# Patient Record
Sex: Female | Born: 2000 | Race: White | Hispanic: No | Marital: Single | Smoking: Never smoker
Health system: Southern US, Community
[De-identification: ages and names within clinical notes are randomized; demographics above are authoritative.]

## PROBLEM LIST (undated history)

## (undated) DIAGNOSIS — J3089 Other allergic rhinitis: Secondary | ICD-10-CM

## (undated) HISTORY — DX: Other allergic rhinitis: J30.89

---
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
## 2021-01-19 ENCOUNTER — Ambulatory Visit: Payer: 59 | Admitting: Cardiology

## 2022-10-22 DIAGNOSIS — S52024A Nondisplaced fracture of olecranon process without intraarticular extension of right ulna, initial encounter for closed fracture: Secondary | ICD-10-CM | POA: Diagnosis not present

## 2022-10-22 DIAGNOSIS — S52025A Nondisplaced fracture of olecranon process without intraarticular extension of left ulna, initial encounter for closed fracture: Secondary | ICD-10-CM | POA: Diagnosis not present

## 2022-10-22 DIAGNOSIS — Y9384 Activity, sleeping: Secondary | ICD-10-CM | POA: Diagnosis not present

## 2022-10-22 DIAGNOSIS — M79622 Pain in left upper arm: Secondary | ICD-10-CM | POA: Diagnosis not present

## 2022-10-22 DIAGNOSIS — W19XXXA Unspecified fall, initial encounter: Secondary | ICD-10-CM | POA: Diagnosis not present

## 2022-10-22 DIAGNOSIS — Y929 Unspecified place or not applicable: Secondary | ICD-10-CM | POA: Diagnosis not present

## 2022-11-06 DIAGNOSIS — S52025A Nondisplaced fracture of olecranon process without intraarticular extension of left ulna, initial encounter for closed fracture: Secondary | ICD-10-CM | POA: Diagnosis not present

## 2022-11-12 DIAGNOSIS — S52025D Nondisplaced fracture of olecranon process without intraarticular extension of left ulna, subsequent encounter for closed fracture with routine healing: Secondary | ICD-10-CM | POA: Diagnosis not present

## 2022-11-12 DIAGNOSIS — S52025A Nondisplaced fracture of olecranon process without intraarticular extension of left ulna, initial encounter for closed fracture: Secondary | ICD-10-CM | POA: Diagnosis not present

## 2023-01-23 ENCOUNTER — Telehealth: Payer: Self-pay | Admitting: Primary Care

## 2023-01-23 NOTE — Telephone Encounter (Signed)
Pt called requesting to pick up vaccination records? Call back # [phone]

## 2023-01-23 NOTE — Telephone Encounter (Signed)
Printed and placed up front for pickup.  Patient is aware

## 2024-06-30 ENCOUNTER — Ambulatory Visit: Payer: Self-pay | Admitting: General Practice

## 2024-06-30 ENCOUNTER — Encounter: Payer: Self-pay | Admitting: General Practice

## 2024-06-30 VITALS — BP 108/64 | HR 86 | Temp 98.4°F | Ht 65.5 in | Wt 125.0 lb

## 2024-06-30 DIAGNOSIS — R42 Dizziness and giddiness: Secondary | ICD-10-CM | POA: Diagnosis not present

## 2024-06-30 DIAGNOSIS — R5382 Chronic fatigue, unspecified: Secondary | ICD-10-CM | POA: Insufficient documentation

## 2024-06-30 DIAGNOSIS — Z7689 Persons encountering health services in other specified circumstances: Secondary | ICD-10-CM | POA: Diagnosis not present

## 2024-06-30 NOTE — Progress Notes (Signed)
 New Patient Office Visit  Subjective    Patient ID: Lauren Mosley, female    DOB: Feb 22, 2001  Age: 23 y.o. MRN: [id number]  CC:  Chief Complaint  Patient presents with   New Patient (Initial Visit)   Dizziness    Weakness, lightheadedness, can't tolerate heat, gets shakey, random nausea, chest pains x years. Patient was seen for this in 2021 when she saw Mallie Gaskins; patient states its getting worse and thinks she has POTS.     HPI Lauren Mosley is a 23 y.o. female presents to re-establish care.  Last PCP/ physical/labs: Mallie Gaskins, NP.  Discussed the use of AI scribe software for clinical note transcription with the patient, who gave verbal consent to proceed.  History of Present Illness Lauren Mosley is a 23 year old female who presents with persistent weakness, lightheadedness, and heat intolerance.  She has been experiencing persistent weakness, lightheadedness, and heat intolerance since 2021. These symptoms occur multiple times a week with varying severity. Upon getting out of bed, she often feels lightheaded with momentary blackouts in her vision. Physical activity exacerbates her symptoms, limiting her walking to about ten minutes before feeling shaky or weak. Caffeine, inadequate sleep, excessive activity, and heat are identified as aggravating factors.  She underwent a cardiac evaluation in the past, including a two-week heart monitor, EKG, echocardiogram, and blood work, all of which returned normal results. She did not follow up with cardiology after moving to [place]  for school for two years.  Over the past month, she has been tracking her symptoms and notes that reducing caffeine intake has significantly decreased her frequency. Nausea can trigger or accompany her symptoms, though it does not occur every time.  In addition to her primary symptoms, she experiences headaches, chest pain, and low energy almost daily. She occasionally feels palpitations but does  not associate them with her other symptoms.  She was in [place]  for school for two years and has recently returned home. No one-sided body weakness. Occasional headaches and intermittent chest pain. Dizziness and low energy almost daily.   No outpatient encounter medications on file as of 06/30/2024.   No facility-administered encounter medications on file as of 06/30/2024.    Past Medical History:  Diagnosis Date   Environmental and seasonal allergies     History reviewed. No pertinent surgical history.  Family History  Problem Relation Age of Onset   ADD / ADHD Mother    ADD / ADHD Brother    Cancer Maternal Grandmother    Early death Maternal Grandmother    Hypertension Maternal Grandfather    Cancer Maternal Grandfather    Asthma Paternal Grandfather    High Cholesterol Paternal Grandfather     Social History   Socioeconomic History   Marital status: Single    Spouse name: Not on file   Number of children: Not on file   Years of education: Not on file   Highest education level: Some college, no degree  Occupational History   Not on file  Tobacco Use   Smoking status: Never   Smokeless tobacco: Never  Vaping Use   Vaping status: Never Used  Substance and Sexual Activity   Alcohol use: Never   Drug use: Never   Sexual activity: Never  Other Topics Concern   Not on file  Social History Narrative   Not on file   Social Drivers of Health   Financial Resource Strain: Patient Declined (06/29/2024)   Overall Financial  Resource Strain (CARDIA)    Difficulty of Paying Living Expenses: Patient declined  Food Insecurity: Patient Declined (06/29/2024)   Hunger Vital Sign    Worried About Running Out of Food in the Last Year: Patient declined    Ran Out of Food in the Last Year: Patient declined  Transportation Needs: No Transportation Needs (06/29/2024)   PRAPARE - Administrator, Civil Service (Medical): No    Lack of Transportation (Non-Medical): No   Physical Activity: Insufficiently Active (06/29/2024)   Exercise Vital Sign    Days of Exercise per Week: 4 days    Minutes of Exercise per Session: 10 min  Stress: No Stress Concern Present (06/29/2024)   [employer] of Occupational Health - Occupational Stress Questionnaire    Feeling of Stress: Only a little  Social Connections: Moderately Integrated (06/29/2024)   Social Connection and Isolation Panel    Frequency of Communication with Friends and Family: Three times a week    Frequency of Social Gatherings with Friends and Family: Twice a week    Attends Religious Services: More than 4 times per year    Active Member of [employer] or Organizations: Yes    Attends Engineer, structural: More than 4 times per year    Marital Status: Never married  Intimate Partner Violence: Not on file    Review of Systems  Constitutional:  Negative for chills and fever.  Respiratory:  Negative for shortness of breath.   Cardiovascular:  Positive for chest pain and palpitations. Negative for leg swelling.  Gastrointestinal:  Positive for nausea. Negative for abdominal pain, constipation, diarrhea, heartburn and vomiting.  Genitourinary:  Negative for dysuria, frequency and urgency.  Neurological:  Positive for dizziness, weakness and headaches.  Endo/Heme/Allergies:  Negative for polydipsia.  Psychiatric/Behavioral:  Negative for depression and suicidal ideas. The patient is not nervous/anxious.         Objective    BP 108/64   Pulse 86   Temp 98.4 F (36.9 C) (Oral)   Ht 5' 5.5 (1.664 m)   Wt 125 lb (56.7 kg)   LMP 06/16/2024 (Exact Date)   SpO2 99%   BMI 20.48 kg/m   Orthostatic VS for the past 24 hrs (Last 3 readings):  BP- Lying Pulse- Lying BP- Sitting Pulse- Sitting BP- Standing at 0 minutes Pulse- Standing at 0 minutes  06/30/24 1540 102/68 93 102/68 86 106/80 115     Physical Exam Vitals and nursing note reviewed.  Constitutional:      Appearance: Normal  appearance.  Cardiovascular:     Rate and Rhythm: Normal rate and regular rhythm.     Pulses: Normal pulses.     Heart sounds: Normal heart sounds.  Pulmonary:     Effort: Pulmonary effort is normal.     Breath sounds: Normal breath sounds.  Neurological:     Mental Status: She is alert and oriented to person, place, and time.  Psychiatric:        Mood and Affect: Mood normal.        Behavior: Behavior normal.        Thought Content: Thought content normal.        Judgment: Judgment normal.          Assessment & Plan:  Lightheadedness Assessment & Plan: Chronic. Has been evaluated by cardiology in 2021.  EKG tracing is personally reviewed.  EKG notes NSR.  No acute changes. Compared to EKG in 2021; similar results.  Orthostatic vitals show  increase in HR however, BP remains consistent.   Will recheck labs today.  Referral placed for cardiology.  Follow up in 2 weeks.   Orders: -     CBC -     Comprehensive metabolic panel with GFR -     Lipid panel -     TSH -     VITAMIN D  25 Hydroxy (Vit-D Deficiency, Fractures) -     Vitamin B12 -     EKG 12-Lead -     Ambulatory referral to Cardiology  Establishing care with new doctor, encounter for Assessment & Plan: EMR reviewed briefly.    Chronic fatigue Assessment & Plan: Reviewed notes, procedure results and labs from 2021.   Orders: -     CBC -     Comprehensive metabolic panel with GFR -     Lipid panel -     TSH -     VITAMIN D  25 Hydroxy (Vit-D Deficiency, Fractures) -     Vitamin B12    Assessment and Plan Assessment & Plan Suspected postural orthostatic tachycardia syndrome (POTS) with associated lightheadedness, dizziness, weakness, fatigue, intermittent chest pain, headache, and nausea Symptoms consistent with suspected POTS, exacerbated by heat, caffeine, lack of sleep, and physical activity. Previous cardiac evaluations normal. Further cardiology evaluation needed for confirmation. - Repeat EKG  today. - Perform lab work today, including cholesterol and vitamin D  levels. - Refer to cardiology for further evaluation and potential diagnosis of POTS. - Perform orthostatic vital signs assessment.   Return in about 2 weeks (around 07/14/2024) for physical.   Carrol Aurora, NP

## 2024-06-30 NOTE — Assessment & Plan Note (Addendum)
 Chronic. Has been evaluated by cardiology in 2021.  EKG tracing is personally reviewed.  EKG notes NSR.  No acute changes. Compared to EKG in 2021; similar results.  Orthostatic vitals show increase in HR however, BP remains consistent.   Will recheck labs today.  Referral placed for cardiology.  Follow up in 2 weeks.

## 2024-06-30 NOTE — Assessment & Plan Note (Signed)
 Reviewed notes, procedure results and labs from 2021.

## 2024-06-30 NOTE — Patient Instructions (Addendum)
 Stop by the lab prior to leaving today. I will notify you of your results once received.    VISIT SUMMARY: Today, you were seen for persistent weakness, lightheadedness, and heat intolerance that you have been experiencing since 2021. You also reported headaches, chest pain, and low energy almost daily. You have previously undergone cardiac evaluations, which were normal, but you have not followed up with cardiology since moving to [place]  for school. Recently, you have been tracking your symptoms and noted that reducing caffeine intake has helped decrease their frequency.  YOUR PLAN: -SUSPECTED POSTURAL ORTHOSTATIC TACHYCARDIA SYNDROME (POTS): POTS is a condition that affects blood flow and can cause symptoms like lightheadedness, dizziness, weakness, fatigue, chest pain, headache, and nausea, especially when standing up. Your symptoms are consistent with POTS and are worsened by heat, caffeine, lack of sleep, and physical activity. We will repeat an EKG and perform lab work today, including checking your cholesterol and vitamin D  levels. You will also be referred to cardiology for further evaluation and a potential diagnosis of POTS. Additionally, we will perform an orthostatic vital signs assessment to check your blood pressure and heart rate changes when you move from lying down to standing up.  INSTRUCTIONS: Please follow up with cardiology for further evaluation and potential diagnosis of POTS. Make sure to get your EKG and lab work done today. We will also perform an orthostatic vital signs assessment during your visit.  Follow up in 2 weeks for a physical.   It was a pleasure meeting you!                  Contains text generated by [employer].                                 Contains text generated by [employer]bridge.

## 2024-06-30 NOTE — Assessment & Plan Note (Signed)
 EMR reviewed briefly.

## 2024-07-01 ENCOUNTER — Ambulatory Visit: Payer: Self-pay | Admitting: General Practice

## 2024-07-01 DIAGNOSIS — E559 Vitamin D deficiency, unspecified: Secondary | ICD-10-CM

## 2024-07-01 DIAGNOSIS — R42 Dizziness and giddiness: Secondary | ICD-10-CM

## 2024-07-01 LAB — CBC
HCT: 40.9 % (ref 36.0–46.0)
Hemoglobin: 13.7 g/dL (ref 12.0–15.0)
MCHC: 33.6 g/dL (ref 30.0–36.0)
MCV: 91.3 fl (ref 78.0–100.0)
Platelets: 227 K/uL (ref 150.0–400.0)
RBC: 4.48 Mil/uL (ref 3.87–5.11)
RDW: 12.4 % (ref 11.5–15.5)
WBC: 5 K/uL (ref 4.0–10.5)

## 2024-07-01 LAB — COMPREHENSIVE METABOLIC PANEL WITH GFR
ALT: 14 U/L (ref 0–35)
AST: 16 U/L (ref 0–37)
Albumin: 4.8 g/dL (ref 3.5–5.2)
Alkaline Phosphatase: 18 U/L — ABNORMAL LOW (ref 39–117)
BUN: 12 mg/dL (ref 6–23)
CO2: 26 meq/L (ref 19–32)
Calcium: 9.8 mg/dL (ref 8.4–10.5)
Chloride: 103 meq/L (ref 96–112)
Creatinine, Ser: 0.79 mg/dL (ref 0.40–1.20)
GFR: 106.63 mL/min (ref >60.00)
Glucose, Bld: 98 mg/dL (ref 70–99)
Potassium: 4 meq/L (ref 3.5–5.1)
Sodium: 138 meq/L (ref 135–145)
Total Bilirubin: 0.5 mg/dL (ref 0.2–1.2)
Total Protein: 7.4 g/dL (ref 6.0–8.3)

## 2024-07-01 LAB — LIPID PANEL
Cholesterol: 147 mg/dL (ref 0–200)
HDL: 62 mg/dL (ref >39.00)
LDL Cholesterol: 75 mg/dL (ref 0–99)
NonHDL: 85.37
Total CHOL/HDL Ratio: 2
Triglycerides: 54 mg/dL (ref 0.0–149.0)
VLDL: 10.8 mg/dL (ref 0.0–40.0)

## 2024-07-01 LAB — TSH: TSH: 2.12 u[IU]/mL (ref 0.35–5.50)

## 2024-07-01 LAB — VITAMIN B12: Vitamin B-12: 592 pg/mL (ref 211–911)

## 2024-07-01 LAB — VITAMIN D 25 HYDROXY (VIT D DEFICIENCY, FRACTURES): VITD: 29.43 ng/mL — ABNORMAL LOW (ref 30.00–100.00)

## 2024-07-01 MED ORDER — VITAMIN D (ERGOCALCIFEROL) 1.25 MG (50000 UNIT) PO CAPS: 50000.0000 [IU] | ORAL_CAPSULE | ORAL | 0 refills | Status: AC

## 2024-07-15 ENCOUNTER — Encounter: Payer: Self-pay | Admitting: General Practice

## 2024-07-15 ENCOUNTER — Ambulatory Visit (INDEPENDENT_AMBULATORY_CARE_PROVIDER_SITE_OTHER): Admitting: General Practice

## 2024-07-15 VITALS — BP 112/62 | HR 95 | Temp 98.4°F | Ht 65.5 in | Wt 126.0 lb

## 2024-07-15 DIAGNOSIS — Z23 Encounter for immunization: Secondary | ICD-10-CM

## 2024-07-15 DIAGNOSIS — Z0000 Encounter for general adult medical examination without abnormal findings: Secondary | ICD-10-CM | POA: Insufficient documentation

## 2024-07-15 DIAGNOSIS — E559 Vitamin D deficiency, unspecified: Secondary | ICD-10-CM | POA: Diagnosis not present

## 2024-07-15 DIAGNOSIS — Z124 Encounter for screening for malignant neoplasm of cervix: Secondary | ICD-10-CM

## 2024-07-15 NOTE — Progress Notes (Signed)
 Established Patient Office Visit  Subjective   Patient ID: Lauren Mosley, female    DOB: 02-Nov-2001  Age: 23 y.o. MRN: [id number]  Chief Complaint  Patient presents with  . Annual Exam  . Menorrhagia    Patient would like to discuss this as some of her cycles are severe.     HPI  Lauren Mosley is a 23 year old female with past medical history of chronic fatigue for complete physical and follow up of chronic conditions.  Immunizations: -Tetanus: Completed in 2014; due  Diet: Fair diet.  Exercise: No regular exercise.  Eye exam: Completed several years ago.  Dental exam: Completed several years ago.   Pap Smear: due; referral placed for GYN.  Patient Active Problem List   Diagnosis Date Noted  . Encounter for screening and preventative care 07/15/2024  . Establishing care with new doctor, encounter for 06/30/2024  . Chronic fatigue 06/30/2024  . Hay fever 09/05/2020  . Lightheadedness 09/05/2020   Past Medical History:  Diagnosis Date  . Environmental and seasonal allergies    History reviewed. No pertinent surgical history. No Known Allergies       07/15/2024   10:35 AM 06/30/2024    3:31 PM  Depression screen PHQ 2/9  Decreased Interest 0 0  Down, Depressed, Hopeless 0 0  PHQ - 2 Score 0 0  Altered sleeping 1 1  Tired, decreased energy 1 1  Change in appetite 0 0  Feeling bad or failure about yourself  0 0  Trouble concentrating 0 1  Moving slowly or fidgety/restless 0 0  Suicidal thoughts 0 0  PHQ-9 Score 2 3  Difficult doing work/chores Not difficult at all Somewhat difficult       07/15/2024   10:35 AM 06/30/2024    3:32 PM  GAD 7 : Generalized Anxiety Score  Nervous, Anxious, on Edge 0 1  Control/stop worrying 0 0  Worry too much - different things 0 0  Trouble relaxing 0 0  Restless 0 0  Easily annoyed or irritable 0 0  Afraid - awful might happen 0 0  Total GAD 7 Score 0 1  Anxiety Difficulty Not difficult at all Not difficult at all       Review of Systems  Constitutional:  Negative for chills, fever, malaise/fatigue and weight loss.  HENT:  Negative for congestion, ear discharge, ear pain, hearing loss, nosebleeds, sinus pain, sore throat and tinnitus.   Eyes:  Negative for blurred vision, double vision, pain, discharge and redness.  Respiratory:  Negative for cough, shortness of breath, wheezing and stridor.   Cardiovascular:  Negative for chest pain, palpitations and leg swelling.  Gastrointestinal:  Negative for abdominal pain, constipation, diarrhea, heartburn, nausea and vomiting.  Genitourinary:  Negative for dysuria, frequency and urgency.  Musculoskeletal:  Negative for myalgias.  Skin:  Negative for rash.  Neurological:  Negative for dizziness, tingling, seizures, weakness and headaches.  Endo/Heme/Allergies:  Negative for polydipsia.  Psychiatric/Behavioral:  Negative for depression, substance abuse and suicidal ideas. The patient is not nervous/anxious.       Objective:     BP 112/62   Pulse 95   Temp 98.4 F (36.9 C) (Temporal)   Ht 5' 5.5 (1.664 m)   Wt 126 lb (57.2 kg)   LMP 07/10/2024 (Exact Date)   SpO2 99%   BMI 20.65 kg/m  BP Readings from Last 3 Encounters:  07/15/24 112/62  06/30/24 108/64  12/08/20 107/70   Wt Readings from Last  3 Encounters:  07/15/24 126 lb (57.2 kg)  06/30/24 125 lb (56.7 kg)  12/08/20 124 lb (56.2 kg) (49%, Z= -0.02)*   * Growth percentiles are based on CDC (Girls, 2-20 Years) data.      Physical Exam Vitals and nursing note reviewed.  Constitutional:      Appearance: Normal appearance.  HENT:     Head: Normocephalic and atraumatic.     Right Ear: Tympanic membrane, ear canal and external ear normal.     Left Ear: Tympanic membrane, ear canal and external ear normal.     Nose: Nose normal.     Mouth/Throat:     Mouth: Mucous membranes are moist.     Pharynx: Oropharynx is clear.  Eyes:     Conjunctiva/sclera: Conjunctivae normal.     Pupils:  Pupils are equal, round, and reactive to light.  Cardiovascular:     Rate and Rhythm: Normal rate and regular rhythm.     Pulses: Normal pulses.     Heart sounds: Normal heart sounds.  Pulmonary:     Effort: Pulmonary effort is normal.     Breath sounds: Normal breath sounds.  Abdominal:     General: Abdomen is flat. Bowel sounds are normal.     Palpations: Abdomen is soft.  Musculoskeletal:        General: Normal range of motion.     Cervical back: Normal range of motion.  Skin:    General: Skin is warm and dry.     Capillary Refill: Capillary refill takes less than 2 seconds.  Neurological:     General: No focal deficit present.     Mental Status: She is alert and oriented to person, place, and time. Mental status is at baseline.  Psychiatric:        Mood and Affect: Mood normal.        Behavior: Behavior normal.        Thought Content: Thought content normal.        Judgment: Judgment normal.      No results found for any visits on 07/15/24.     The ASCVD Risk score (Arnett DK, et al., 2019) failed to calculate for the following reasons:   The 2019 ASCVD risk score is only valid for ages 57 to 46    Assessment & Plan:  Encounter for screening and preventative care Assessment & Plan: Immunizations tdap given today. Pap smear due Discussed the importance of a healthy diet and regular exercise in order for weight loss, and to reduce the risk of further co-morbidity.  Exam stable. Labs pending.  Follow up in 1 year for repeat physical.    Screening for cervical cancer -     Ambulatory referral to Gynecology  Need for Tdap vaccination -     Tdap vaccine greater than or equal to 7yo IM  Vitamin D  deficiency -     VITAMIN D  25 Hydroxy (Vit-D Deficiency, Fractures); Future     Return in about 1 year (around 07/15/2025) for physical.    Carrol Aurora, NP

## 2024-07-15 NOTE — Assessment & Plan Note (Signed)
 Immunizations tdap- given today.  Pap smear due  Discussed the importance of a healthy diet and regular exercise in order for weight loss, and to reduce the risk of further co-morbidity.  Exam stable. Labs pending.  Follow up in 1 year for repeat physical.

## 2024-07-15 NOTE — Patient Instructions (Signed)
 Schedule lab only appointment for three months.  You will either be contacted via phone regarding your referral to gynecology, or you may receive a letter on your MyChart portal from our referral team with instructions for scheduling an appointment. Please let us  know if you have not been contacted by anyone within two weeks.  Follow up in one year for physical.   It was a pleasure to see you today!

## 2024-07-26 ENCOUNTER — Ambulatory Visit: Attending: Cardiology | Admitting: Cardiology

## 2024-07-26 ENCOUNTER — Encounter: Payer: Self-pay | Admitting: Cardiology

## 2024-07-26 VITALS — BP 80/54 | HR 80 | Ht 65.0 in | Wt 126.6 lb

## 2024-07-26 DIAGNOSIS — G909 Disorder of the autonomic nervous system, unspecified: Secondary | ICD-10-CM | POA: Diagnosis not present

## 2024-07-26 DIAGNOSIS — R55 Syncope and collapse: Secondary | ICD-10-CM | POA: Diagnosis not present

## 2024-07-26 NOTE — Progress Notes (Signed)
 Cardiology Office Note:    Date:  07/26/2024   ID:  Lauren Mosley, DOB 03-28-2001, MRN [id number]  PCP:  Vincente Shivers, NP  [facility] Cardiologist:  None  [facility] Electrophysiologist:  None   Referring MD: Vincente Shivers, NP   Chief Complaint  Patient presents with   New Patient (Initial Visit)    Pt symptoms every morning/every day.    History of Present Illness:    Lauren Mosley is a 23 y.o. female with no significant past medical history who is being seen for presyncope.  Previously seen by myself 3 years ago for similar symptoms.  Endorses a constellation of symptoms including dizziness, blurry vision, palpitations sometimes with standing, recently sitting while playing the keyboard at her church.  Denies syncope.  Symptoms get worse if heart awareness.  She has been drinking water, and trying to stay hydrated but symptoms seem to have worsened of late.     Prior notes/testing Cardiac monitor 12/2020 no significant or sustained arrhythmias. Echocardiogram 11/02/2020 showed normal systolic and diastolic function, EF 60 to 65%.  Past Medical History:  Diagnosis Date   Environmental and seasonal allergies     History reviewed. No pertinent surgical history.  Current Medications: Current Meds  Medication Sig   Vitamin D , Ergocalciferol , (DRISDOL ) 1.25 MG (50000 UNIT) CAPS capsule Take 1 capsule (50,000 Units total) by mouth every 7 (seven) days.     Allergies:   Patient has no known allergies.   Social History   Socioeconomic History   Marital status: Single    Spouse name: Not on file   Number of children: Not on file   Years of education: Not on file   Highest education level: Some college, no degree  Occupational History   Not on file  Tobacco Use   Smoking status: Never   Smokeless tobacco: Never  Vaping Use   Vaping status: Never Used  Substance and Sexual Activity   Alcohol use: Never   Drug use: Never   Sexual activity: Never  Other  Topics Concern   Not on file  Social History Narrative   Not on file   Social Drivers of Health   Financial Resource Strain: Patient Declined (06/29/2024)   Overall Financial Resource Strain (CARDIA)    Difficulty of Paying Living Expenses: Patient declined  Food Insecurity: Patient Declined (06/29/2024)   Hunger Vital Sign    Worried About Running Out of Food in the Last Year: Patient declined    Ran Out of Food in the Last Year: Patient declined  Transportation Needs: No Transportation Needs (06/29/2024)   PRAPARE - Administrator, Civil Service (Medical): No    Lack of Transportation (Non-Medical): No  Physical Activity: Insufficiently Active (06/29/2024)   Exercise Vital Sign    Days of Exercise per Week: 4 days    Minutes of Exercise per Session: 10 min  Stress: No Stress Concern Present (06/29/2024)   [employer] of Occupational Health - Occupational Stress Questionnaire    Feeling of Stress: Only a little  Social Connections: Moderately Integrated (06/29/2024)   Social Connection and Isolation Panel    Frequency of Communication with Friends and Family: Three times a week    Frequency of Social Gatherings with Friends and Family: Twice a week    Attends Religious Services: More than 4 times per year    Active Member of [employer] or Organizations: Yes    Attends Banker Meetings: More than 4 times per  year    Marital Status: Never married     Family History: The patient's family history includes ADD / ADHD in her brother and mother; Asthma in her paternal grandfather; Cancer in her maternal grandfather and maternal grandmother; Early death in her maternal grandmother; High Cholesterol in her paternal grandfather; Hypertension in her maternal grandfather.  ROS:   Please see the history of present illness.     All other systems reviewed and are negative.  EKGs/Labs/Other Studies Reviewed:    The following studies were reviewed today:  EKG  Interpretation Date/Time:  Monday July 26 2024 10:53:32 EDT Ventricular Rate:  80 PR Interval:  116 QRS Duration:  84 QT Interval:  362 QTC Calculation: 417 R Axis:   91  Text Interpretation: Normal sinus rhythm Rightward axis Confirmed by Darliss Rogue ([id number]) on 07/26/2024 11:06:39 AM    Recent Labs: 06/30/2024: ALT 14; BUN 12; Creatinine, Ser 0.79; Hemoglobin 13.7; Platelets 227.0; Potassium 4.0; Sodium 138; TSH 2.12  Recent Lipid Panel    Component Value Date/Time   CHOL 147 06/30/2024 1602   TRIG 54.0 06/30/2024 1602   HDL 62.00 06/30/2024 1602   CHOLHDL 2 06/30/2024 1602   VLDL 10.8 06/30/2024 1602   LDLCALC 75 06/30/2024 1602     Risk Assessment/Calculations:      Physical Exam:    VS:  BP (!) 80/54 (BP Location: Left Arm, Patient Position: Sitting, Cuff Size: Normal)   Pulse 80 Comment: ORTHOSTATIC 3 STANDING HR 104  Ht 5' 5 (1.651 m)   Wt 126 lb 9.6 oz (57.4 kg)   LMP 07/10/2024 (Exact Date)   SpO2 100%   BMI 21.07 kg/m     Wt Readings from Last 3 Encounters:  07/26/24 126 lb 9.6 oz (57.4 kg)  07/15/24 126 lb (57.2 kg)  06/30/24 125 lb (56.7 kg)     GEN:  Well nourished, well developed in no acute distress HEENT: Normal NECK: No JVD; No carotid bruits CARDIAC: RRR, no murmurs, rubs, gallops RESPIRATORY:  Clear to auscultation without rales, wheezing or rhonchi  ABDOMEN: Soft, non-tender, non-distended MUSCULOSKELETAL:  No edema; No deformity  SKIN: Warm and dry NEUROLOGIC:  Alert and oriented x 3 PSYCHIATRIC:  Normal affect   ASSESSMENT:    1. Pre-syncope   2. Autonomic dysfunction    PLAN:    In order of problems listed above:  Patient with history of presyncopal episodes associated with palpitations, dizziness, typically occurring humid/hot weather.  Orthostatic vitals in the office with no evidence of orthostasis.  Endorses palpitations, but no noted tachycardia.  Previous echo and cardiac monitor was unrevealing.  Her symptoms of  suggestive of autonomic dysfunction, does not meet criteria for POTS due to lack of tachycardia.  BP today was low.  Advised patient to start salt tablets 1 tab 3 times daily, adequate hydration.  Encouraged her to start any regular aerobic exercising such as stationary biking, swimming, rowing.  Follow-up in 3 months.       Medication Adjustments/Labs and Tests Ordered: Current medicines are reviewed at length with the patient today.  Concerns regarding medicines are outlined above.  Orders Placed This Encounter  Procedures   EKG 12-Lead   No orders of the defined types were placed in this encounter.   Patient Instructions  Medication Instructions:  Your physician recommends that you continue on your current medications as directed. Please refer to the Current Medication list given to you today.   *If you need a refill on your cardiac medications  before your next appointment, please call your pharmacy*  Lab Work: No labs ordered today  If you have labs (blood work) drawn today and your tests are completely normal, you will receive your results only by: MyChart Message (if you have MyChart) OR A paper copy in the mail If you have any lab test that is abnormal or we need to change your treatment, we will call you to review the results.  Testing/Procedures: No test ordered today   Follow-Up: At [facility], you and your health needs are our priority.  As part of our continuing mission to provide you with exceptional heart care, our providers are all part of one team.  This team includes your primary Cardiologist (physician) and Advanced Practice Providers or APPs (Physician Assistants and Nurse Practitioners) who all work together to provide you with the care you need, when you need it.  Your next appointment:   3 month(s)  Provider:   You may see Dr. Darliss or one of the following Advanced Practice Providers on your designated Care Team:   Lonni Meager,  NP Lesley Maffucci, PA-C Bernardino Bring, PA-C Cadence [place], PA-C Tylene Lunch, NP Barnie Hila, NP    We recommend signing up for the patient portal called MyChart.  Sign up information is provided on this After Visit Summary.  MyChart is used to connect with patients for Virtual Visits (Telemedicine).  Patients are able to view lab/test results, encounter notes, upcoming appointments, etc.  Non-urgent messages can be sent to your provider as well.   To learn more about what you can do with MyChart, go to [email or web address].      Signed, Redell Darliss, MD  07/26/2024 11:33 AM     [facility]

## 2024-07-26 NOTE — Patient Instructions (Signed)
 Medication Instructions:  Your physician recommends that you continue on your current medications as directed. Please refer to the Current Medication list given to you today.   *If you need a refill on your cardiac medications before your next appointment, please call your pharmacy*  Lab Work: No labs ordered today  If you have labs (blood work) drawn today and your tests are completely normal, you will receive your results only by: MyChart Message (if you have MyChart) OR A paper copy in the mail If you have any lab test that is abnormal or we need to change your treatment, we will call you to review the results.  Testing/Procedures: No test ordered today   Follow-Up: At [facility], you and your health needs are our priority.  As part of our continuing mission to provide you with exceptional heart care, our providers are all part of one team.  This team includes your primary Cardiologist (physician) and Advanced Practice Providers or APPs (Physician Assistants and Nurse Practitioners) who all work together to provide you with the care you need, when you need it.  Your next appointment:   3 month(s)  Provider:   You may see Dr. Darliss or one of the following Advanced Practice Providers on your designated Care Team:   Lonni Meager, NP Lesley Maffucci, PA-C Bernardino Bring, PA-C Cadence [place], PA-C Tylene Lunch, NP Barnie Hila, NP    We recommend signing up for the patient portal called MyChart.  Sign up information is provided on this After Visit Summary.  MyChart is used to connect with patients for Virtual Visits (Telemedicine).  Patients are able to view lab/test results, encounter notes, upcoming appointments, etc.  Non-urgent messages can be sent to your provider as well.   To learn more about what you can do with MyChart, go to [email or web address].

## 2024-08-10 ENCOUNTER — Telehealth: Payer: Self-pay | Admitting: Cardiology

## 2024-08-10 NOTE — Telephone Encounter (Signed)
 I have reviewed her chart and prior work up. I agree with the workup and recommendations so far. I do not think I have anything additional to offer.  Thanks MJP

## 2024-08-10 NOTE — Telephone Encounter (Signed)
  Patient would like to switch from Dr Darliss to Dr [place].

## 2024-08-20 ENCOUNTER — Encounter: Payer: Self-pay | Admitting: General Practice

## 2024-08-27 ENCOUNTER — Other Ambulatory Visit (HOSPITAL_COMMUNITY)
Admission: RE | Admit: 2024-08-27 | Discharge: 2024-08-27 | Disposition: A | Discharge status: Home / Self Care | Source: Ambulatory Visit | Attending: Advanced Practice Midwife | Admitting: Advanced Practice Midwife

## 2024-08-27 ENCOUNTER — Encounter: Payer: Self-pay | Admitting: Advanced Practice Midwife

## 2024-08-27 ENCOUNTER — Ambulatory Visit: Admitting: Advanced Practice Midwife

## 2024-08-27 VITALS — BP 106/67 | HR 93 | Ht 65.0 in | Wt 126.0 lb

## 2024-08-27 DIAGNOSIS — Z01419 Encounter for gynecological examination (general) (routine) without abnormal findings: Secondary | ICD-10-CM | POA: Insufficient documentation

## 2024-08-27 DIAGNOSIS — Z124 Encounter for screening for malignant neoplasm of cervix: Secondary | ICD-10-CM | POA: Diagnosis not present

## 2024-08-27 NOTE — Patient Instructions (Signed)
 How to Have Safe Sex Having safe sex means taking steps before and during sex to reduce your risk of: Getting a sexually transmitted infection (STI). Giving your partner an STI. Unwanted or unplanned pregnancy. How to have safe sex Ways you can have safe sex  Limit your sex partners to only one partner who is only having sex with you. Avoid using alcohol and drugs before having sex. Alcohol and drugs can affect your judgment. Before having sex with a new partner: Talk to your partner about past partners, past STIs, and drug use. Get screened for STIs and discuss the results with your partner. Ask your partner to get screened too. Check your body regularly for sores, blisters, rashes, or unusual discharge. If you notice any of these things, call your health care provider. Avoid sexual contact if you have symptoms of an infection or you're being treated for an STI. While having sex, use a condom. Make sure to: Use a condom every time you have vaginal, oral, or anal sex. Both females and males should wear condoms during oral sex. Do not use a female condom and a female condom at the same time during vaginal sex. Using both types at the same time can cause condoms to break. Keep condoms in place from the beginning to the end of sexual activity. Use a latex condom, if possible. Latex condoms offer the best protection. Use only water-based lubricants with a condom. Using petroleum-based lubricants or oils will weaken the condom and increase the chance that it will break. Ways your health care provider can help you have safe sex  See your provider for regular screenings, exams, and tests for STIs. Talk with your provider about what kind of birth control is best for you. Get vaccinated against hepatitis B and human papillomavirus (HPV). If you're at risk of getting human immunodeficiency virus (HIV), talk with your provider about taking a medicine to prevent HIV. You're at risk for HIV if you: Are  a female who has sex with other males. Are sexually active with more than one partner. Take drugs by injection. Have a sex partner who has HIV. Have unprotected sex. Have sex with someone who has sex with both males and females. Follow these instructions at home: Take your medicines only as told. Call your provider if you think you might be pregnant. Call your provider if have any symptoms of an infection. Where to find more information Centers for Disease Control and Prevention (CDC): [employer].no Office on Women's Health: TravelLesson.ca This information is not intended to replace advice given to you by your health care provider. Make sure you discuss any questions you have with your health care provider. Document Revised: 03/26/2023 Document Reviewed: 03/26/2023 Elsevier Patient Education  2024 Elsevier Inc. Pap Test: What to Know Why am I having this test? A Pap test, also called a Pap smear, is a screening test to check for signs of: Infection. Cancer of the cervix. The cervix is the lowest part of the uterus. Precancerous changes. These are changes that may be a sign that cancer is developing. Females need this test regularly. In general, you should have a Pap test every 3 years until you reach menopause or you are 22 years old. If you are 4-4 years old you may choose to have their Pap test done at the same time as an human papillomavirus (HPV) test every 5 years instead of every 3 years. Your health care provider may recommend having Pap tests more or less often  depending on your medical conditions and past Pap test results. What is being tested? Cervical cells are tested for signs of infection or abnormalities. What kind of sample is taken?  Your provider will collect a sample of cells from the surface of your cervix. This will be done using a small cotton swab, plastic spatula, or brush that is inserted into your vagina using a tool called a speculum. This sample is often  collected during a pelvic exam, when you are lying on your back on an exam table with your feet in footrests, called stirrups. In some cases, fluids (secretions) from the cervix or vagina may also be collected. How do I prepare for this test? Know where you are in your menstrual cycle. If you're menstruating on the day of the test, you may be asked to reschedule. You may need to reschedule if you have a known vaginal infection on the day of the test. Follow instructions from your provider about: Changing or stopping your regular medicines. Some medicines, such as vaginal medicines and tetracycline, can cause abnormal test results. Avoiding douching 2-3 days before or the day of the test. Tell a health care provider about: Any allergies you have. All medicines you take. These include vitamins, herbs, eye drops, and creams. Any bleeding problems you have. Any surgeries you've had. Any medical problems you have. Whether you're pregnant or may be pregnant. How are the results reported? Your test results will be reported as either abnormal or normal. What do the results mean? A normal test result means that you do not have signs of cancer of the cervix. An abnormal result may mean that you have: Cancer. A Pap test by itself is not enough to diagnose cancer. You will have more tests done if cancer is suspected. Precancerous changes in your cervix. Inflammation of the cervix. A sexually transmitted infection (STI). A fungal infection. An infection from a parasite. Talk with your provider about what your results mean. More tests may be needed. Questions to ask your health care provider Ask your provider, or the department that is doing the test: When will my results be ready? How will I get my results? What are my treatment options? What other tests do I need? What are my next steps? This information is not intended to replace advice given to you by your health care provider. Make sure you  discuss any questions you have with your health care provider. Document Revised: 01/24/2024 Document Reviewed: 01/24/2024 Elsevier Patient Education  2025 [employer]. Preventive Care 73-60 Years Old, Female Preventive care refers to lifestyle choices and visits with your health care provider that can promote health and wellness. Preventive care visits are also called wellness exams. What can I expect for my preventive care visit? Counseling During your preventive care visit, your health care provider may ask about your: Medical history, including: Past medical problems. Family medical history. Pregnancy history. Current health, including: Menstrual cycle. Method of birth control. Emotional well-being. Home life and relationship well-being. Sexual activity and sexual health. Lifestyle, including: Alcohol, nicotine or tobacco, and drug use. Access to firearms. Diet, exercise, and sleep habits. Work and work Astronomer. Sunscreen use. Safety issues such as seatbelt and bike helmet use. Physical exam Your health care provider may check your: Height and weight. These may be used to calculate your BMI (body mass index). BMI is a measurement that tells if you are at a healthy weight. Waist circumference. This measures the distance around your waistline. This measurement also tells  if you are at a healthy weight and may help predict your risk of certain diseases, such as type 2 diabetes and high blood pressure. Heart rate and blood pressure. Body temperature. Skin for abnormal spots. What immunizations do I need?  Vaccines are usually given at various ages, according to a schedule. Your health care provider will recommend vaccines for you based on your age, medical history, and lifestyle or other factors, such as travel or where you work. What tests do I need? Screening Your health care provider may recommend screening tests for certain conditions. This may include: Pelvic exam and  Pap test. Lipid and cholesterol levels. Diabetes screening. This is done by checking your blood sugar (glucose) after you have not eaten for a while (fasting). Hepatitis B test. Hepatitis C test. HIV (human immunodeficiency virus) test. STI (sexually transmitted infection) testing, if you are at risk. BRCA-related cancer screening. This may be done if you have a family history of breast, ovarian, tubal, or peritoneal cancers. Talk with your health care provider about your test results, treatment options, and if necessary, the need for more tests. Follow these instructions at home: Eating and drinking  Eat a healthy diet that includes fresh fruits and vegetables, whole grains, lean protein, and low-fat dairy products. Take vitamin and mineral supplements as recommended by your health care provider. Do not drink alcohol if: Your health care provider tells you not to drink. You are pregnant, may be pregnant, or are planning to become pregnant. If you drink alcohol: Limit how much you have to 0-1 drink a day. Know how much alcohol is in your drink. In the [place] one drink equals one 12 oz bottle of beer (355 mL), one 5 oz glass of wine (148 mL), or one 1 oz glass of hard liquor (44 mL). Lifestyle Brush your teeth every morning and night with fluoride toothpaste. Floss one time each day. Exercise for at least 30 minutes 5 or more days each week. Do not use any products that contain nicotine or tobacco. These products include cigarettes, chewing tobacco, and vaping devices, such as e-cigarettes. If you need help quitting, ask your health care provider. Do not use drugs. If you are sexually active, practice safe sex. Use a condom or other form of protection to prevent STIs. If you do not wish to become pregnant, use a form of birth control. If you plan to become pregnant, see your health care provider for a prepregnancy visit. Find healthy ways to manage stress, such as: Meditation, yoga, or  listening to music. Journaling. Talking to a trusted person. Spending time with friends and family. Minimize exposure to UV radiation to reduce your risk of skin cancer. Safety Always wear your seat belt while driving or riding in a vehicle. Do not drive: If you have been drinking alcohol. Do not ride with someone who has been drinking. If you have been using any mind-altering substances or drugs. While texting. When you are tired or distracted. Wear a helmet and other protective equipment during sports activities. If you have firearms in your house, make sure you follow all gun safety procedures. Seek help if you have been physically or sexually abused. What's next? Go to your health care provider once a year for an annual wellness visit. Ask your health care provider how often you should have your eyes and teeth checked. Stay up to date on all vaccines. This information is not intended to replace advice given to you by your health care provider. Make  sure you discuss any questions you have with your health care provider. Document Revised: 05/02/2021 Document Reviewed: 05/02/2021 Elsevier Patient Education  2024 [employer].

## 2024-08-27 NOTE — Progress Notes (Signed)
 Gynecology Annual Exam  PCP: Vincente Shivers, NP  Chief Complaint: annual exam   History of Present Illness: Patient is a 23 y.o. G0P0000 presents for annual exam. The patient has no gyn complaints today. She has symptoms of light headedness, shakiness and vision going black. Plans to see a cardiologist. Has diagnosis of autonomic dysfunction.  LMP: Patient's last menstrual period was 08/09/2024. Menarche:15 Average Interval: regular, 28 days Duration of flow: 5 days Heavy Menses: first couple days Clots: no Intermenstrual Bleeding: no Postcoital Bleeding: not applicable Dysmenorrhea: yes  The patient is not sexually active. She currently uses abstinence for contraception. She denies dyspareunia.  The patient does perform self breast exams.  There is no notable family history of breast or ovarian cancer in her family.  The patient wears seatbelts: yes.  The patient has regular exercise: she walks regularly. Some limitations with exercise due to cardiac/neuro symptoms. She admits healthy lifestyle; generally healthy diet, adequate hydration and sleep.    The patient denies current symptoms of depression.  Has some anxiety. Not medicated. Plans to talk with her PCP.  Review of Systems: Review of Systems  Constitutional:  Negative for chills and fever.  HENT:  Negative for congestion, ear discharge, ear pain, hearing loss, sinus pain and sore throat.   Eyes:  Negative for blurred vision and double vision.  Respiratory:  Negative for cough, shortness of breath and wheezing.   Cardiovascular:  Negative for chest pain, palpitations and leg swelling.  Gastrointestinal:  Negative for abdominal pain, blood in stool, constipation, diarrhea, heartburn, melena, nausea and vomiting.  Genitourinary:  Negative for dysuria, flank pain, frequency, hematuria and urgency.  Musculoskeletal:  Negative for back pain, joint pain and myalgias.  Skin:  Negative for itching and rash.  Neurological:   Positive for dizziness. Negative for tingling, tremors, sensory change, speech change, focal weakness, seizures, loss of consciousness, weakness and headaches.       Positive for lightheaded, vision going black  Endo/Heme/Allergies:  Negative for environmental allergies. Does not bruise/bleed easily.  Psychiatric/Behavioral:  Negative for depression, hallucinations, memory loss, substance abuse and suicidal ideas. The patient is not nervous/anxious and does not have insomnia.        Positive for anxiety    Past Medical History:  Patient Active Problem List   Diagnosis Date Noted   Chronic fatigue 06/30/2024   Lightheadedness 09/05/2020    Past Surgical History:  History reviewed. No pertinent surgical history.  Gynecologic History:  Patient's last menstrual period was 08/09/2024.  Last Pap: first PAP today  Obstetric History: G0P0000  Family History:  Family History  Problem Relation Age of Onset   ADD / ADHD Mother    ADD / ADHD Brother    Cancer Maternal Grandmother    Early death Maternal Grandmother    Hypertension Maternal Grandfather    Cancer Maternal Grandfather    Asthma Paternal Grandfather    High Cholesterol Paternal Grandfather     Social History:  Social History   Socioeconomic History   Marital status: Single    Spouse name: Not on file   Number of children: Not on file   Years of education: Not on file   Highest education level: Some college, no degree  Occupational History   Not on file  Tobacco Use   Smoking status: Never   Smokeless tobacco: Never  Vaping Use   Vaping status: Never Used  Substance and Sexual Activity   Alcohol use: Never   Drug  use: Never   Sexual activity: Never  Other Topics Concern   Not on file  Social History Narrative   Not on file   Social Drivers of Health   Financial Resource Strain: Patient Declined (06/29/2024)   Overall Financial Resource Strain (CARDIA)    Difficulty of Paying Living Expenses: Patient  declined  Food Insecurity: Patient Declined (06/29/2024)   Hunger Vital Sign    Worried About Running Out of Food in the Last Year: Patient declined    Ran Out of Food in the Last Year: Patient declined  Transportation Needs: No Transportation Needs (06/29/2024)   PRAPARE - Administrator, Civil Service (Medical): No    Lack of Transportation (Non-Medical): No  Physical Activity: Insufficiently Active (06/29/2024)   Exercise Vital Sign    Days of Exercise per Week: 4 days    Minutes of Exercise per Session: 10 min  Stress: No Stress Concern Present (06/29/2024)   [employer] of Occupational Health - Occupational Stress Questionnaire    Feeling of Stress: Only a little  Social Connections: Moderately Integrated (06/29/2024)   Social Connection and Isolation Panel    Frequency of Communication with Friends and Family: Three times a week    Frequency of Social Gatherings with Friends and Family: Twice a week    Attends Religious Services: More than 4 times per year    Active Member of [employer] or Organizations: Yes    Attends Engineer, structural: More than 4 times per year    Marital Status: Never married  Catering manager Violence: Not on file    Allergies:  No Known Allergies  Medications: Prior to Admission medications   Medication Sig Start Date End Date Taking? Authorizing Provider  Vitamin D , Ergocalciferol , (DRISDOL ) 1.25 MG (50000 UNIT) CAPS capsule Take 1 capsule (50,000 Units total) by mouth every 7 (seven) days. 07/01/24   Vincente Shivers, NP    Physical Exam Vitals: BP 106/67   Pulse 93   Ht 5' 5 (1.651 m)   Wt 126 lb (57.2 kg)   LMP 08/09/2024   BMI 20.97 kg/m   General: NAD HEENT: normocephalic, anicteric Thyroid : no enlargement, no palpable nodules Pulmonary: No increased work of breathing, CTAB Cardiovascular: RRR, distal pulses 2+ Breast: Breast symmetrical, no tenderness, no palpable nodules or masses, no skin or nipple retraction  present, no nipple discharge.  No axillary or supraclavicular lymphadenopathy. Abdomen: NABS, soft, non-tender, non-distended.  Umbilicus without lesions.  No hepatomegaly, splenomegaly or masses palpable. No evidence of hernia  Genitourinary:  External: Normal external female genitalia.  Normal urethral meatus, normal Bartholin's and Skene's glands.    Vagina: Normal vaginal mucosa, no evidence of prolapse.    Cervix: Grossly normal in appearance, no bleeding  Uterus: Non-enlarged, mobile, normal contour.  No CMT  Adnexa: ovaries non-enlarged, no adnexal masses  Rectal: deferred  Lymphatic: no evidence of inguinal lymphadenopathy Extremities: no edema, erythema, or tenderness Neurologic: Grossly intact Psychiatric: mood appropriate, affect full   Assessment: 23 y.o. G0P0000 routine annual exam  Plan: Problem List Items Addressed This Visit   None Visit Diagnoses       Well woman exam with routine gynecological exam    -  Primary   Relevant Orders   Cytology - PAP     Cervical cancer screening       Relevant Orders   Cytology - PAP       1) 4) Gardasil Series discussed and if applicable offered to patient - Patient  has previously completed 3 shot series   2) STI screening  was offered and declined  3)  ASCCP guidelines and rationale discussed.  Patient opts for every 3 years screening interval following normal PAP  4) Contraception - the patient is currently using  abstinence.  She is happy with her current form of contraception and plans to continue We discussed safe sex practices to reduce her furture risk of STI's.    5) Return in about 1 year (around 08/27/2025) for annual established gyn.   Slater Rains, CNM 08/27/2024 5:24 PM

## 2024-08-31 ENCOUNTER — Telehealth (INDEPENDENT_AMBULATORY_CARE_PROVIDER_SITE_OTHER): Admitting: General Practice

## 2024-08-31 ENCOUNTER — Encounter: Payer: Self-pay | Admitting: General Practice

## 2024-08-31 VITALS — Ht 65.0 in | Wt 125.0 lb

## 2024-08-31 DIAGNOSIS — F419 Anxiety disorder, unspecified: Secondary | ICD-10-CM | POA: Diagnosis not present

## 2024-08-31 LAB — CYTOLOGY - PAP
Comment: NEGATIVE
Diagnosis: NEGATIVE
High risk HPV: NEGATIVE

## 2024-08-31 MED ORDER — HYDROXYZINE HCL 10 MG PO TABS
10.0000 mg | ORAL_TABLET | Freq: Every evening | ORAL | 0 refills | Status: AC | PRN
Start: 2024-08-31 — End: ?

## 2024-08-31 NOTE — Progress Notes (Signed)
 Virtual Visit via Video Note  I connected with Lauren Mosley on 08/31/24 at  2:40 PM EDT by a video enabled telemedicine application and verified that I am speaking with the correct person using two identifiers.  Patient Location: Home Provider Location: Office/Clinic  I discussed the limitations, risks, security, and privacy concerns of performing an evaluation and management service by video and the availability of in person appointments. I also discussed with the patient that there may be a patient responsible charge related to this service. The patient expressed understanding and agreed to proceed.  Subjective: PCP: Vincente Shivers, NP  Chief Complaint  Patient presents with   Anxiety    Patient has not ever been on anything in the past and is not currently on any medication. Patient has struggled with anxiety for a while but getting worse over the last couple months.   Anxiety    Lauren Mosley is a 23 year old female with past medical history of chronic fatigue, lightheadedness presents today for an acute visit to discuss anxiety.   Discussed the use of AI scribe software for clinical note transcription with the patient, who gave verbal consent to proceed.  History of Present Illness Lauren Mosley is a 23 year old female who presents with increased anxiety symptoms over the past two months.  Over the past one to two months, she has experienced an increase in the frequency and severity of her anxiety symptoms, which she previously managed independently. 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  Her anxiety symptoms include lightheadedness, chest tightness, difficulty breathing, and occasional shaking. These symptoms occur a couple of times a week and are often triggered by upcoming events where she will be in front of an audience. She has  not been treated for anxiety before and has never participated in therapy. She denies any thoughts of self-harm or harm to others.  She has a history of heart-related symptoms and is currently in the process of switching to a different cardiologist.   ROS: Per HPI  Current Outpatient Medications:    hydrOXYzine (ATARAX) 10 MG tablet, Take 1 tablet (10 mg total) by mouth at bedtime as needed for anxiety., Disp: 30 tablet, Rfl: 0   Vitamin D , Ergocalciferol , (DRISDOL ) 1.25 MG (50000 UNIT) CAPS capsule, Take 1 capsule (50,000 Units total) by mouth every 7 (seven) days., Disp: 12 capsule, Rfl: 0  Observations/Objective: Today's Vitals   08/31/24 1417  Weight: 125 lb (56.7 kg)  Height: 5' 5 (1.651 m)   Physical Exam Nursing note reviewed.  Constitutional:      Appearance: Normal appearance.  Eyes:     Conjunctiva/sclera: Conjunctivae normal.  Pulmonary:     Effort: Pulmonary effort is normal.  Neurological:     Mental Status: She is alert and oriented to person, place, and time.  Psychiatric:        Mood and Affect: Mood normal.        Behavior: Behavior normal.        Thought Content: Thought content normal.        Judgment: Judgment normal.     Assessment and Plan: Anxiety -     hydrOXYzine HCl; Take 1 tablet (10 mg total) by mouth at bedtime as needed for anxiety.  Dispense: 30 tablet; Refill: 0   Assessment and Plan Assessment & Plan Anxiety disorder Increased anxiety symptoms  with triggers related to public speaking and church responsibilities. No suicidal ideation. No prior treatment. - Prescribed hydroxyzine 10 mg PRN, especially at bedtime. Rx sent. - Discussed therapy options, virtual and in-person.  - She will update in 3-4 weeks.    Follow Up Instructions: Return if symptoms worsen or fail to improve.   I discussed the assessment and treatment plan with the patient. The patient was provided an opportunity to ask questions, and all were answered. The patient  agreed with the plan and demonstrated an understanding of the instructions.   The patient was advised to call back or seek an in-person evaluation if the symptoms worsen or if the condition fails to improve as anticipated.  The above assessment and management plan was discussed with the patient. The patient verbalized understanding of and has agreed to the management plan.   Carrol Aurora, NP

## 2024-08-31 NOTE — Patient Instructions (Signed)
 Start Hydroxyzine 10 mg once daily at bedtime as needed for anxiety.   Update in me 3-4 weeks on your symptoms.   It was a pleasure to see you today!

## 2024-09-04 ENCOUNTER — Ambulatory Visit: Payer: Self-pay | Admitting: Advanced Practice Midwife

## 2024-09-22 ENCOUNTER — Encounter: Payer: Self-pay | Admitting: General Practice

## 2024-09-24 ENCOUNTER — Ambulatory Visit: Admitting: General Practice

## 2024-10-04 ENCOUNTER — Other Ambulatory Visit: Payer: Self-pay | Admitting: General Practice

## 2024-10-04 DIAGNOSIS — E559 Vitamin D deficiency, unspecified: Secondary | ICD-10-CM

## 2024-10-18 ENCOUNTER — Other Ambulatory Visit

## 2024-10-25 ENCOUNTER — Ambulatory Visit: Admitting: Cardiology

## 2024-10-26 ENCOUNTER — Encounter: Payer: Self-pay | Admitting: General Practice

## 2024-11-01 MED ORDER — HYDROXYZINE HCL 10 MG PO TABS: 10.0000 mg | ORAL_TABLET | Freq: Every evening | ORAL | 0 refills | Status: AC | PRN

## 2025-07-18 ENCOUNTER — Encounter: Admitting: General Practice
